# Patient Record
Sex: Female | Born: 1960 | Race: Black or African American | Hispanic: No | Marital: Single | Smoking: Never smoker
Health system: Southern US, Community
[De-identification: ages and names within clinical notes are randomized; demographics above are authoritative.]

## PROBLEM LIST (undated history)

## (undated) DIAGNOSIS — Z8619 Personal history of other infectious and parasitic diseases: Secondary | ICD-10-CM

## (undated) DIAGNOSIS — D219 Benign neoplasm of connective and other soft tissue, unspecified: Secondary | ICD-10-CM

## (undated) DIAGNOSIS — R030 Elevated blood-pressure reading, without diagnosis of hypertension: Secondary | ICD-10-CM

## (undated) DIAGNOSIS — B029 Zoster without complications: Secondary | ICD-10-CM

## (undated) DIAGNOSIS — T7840XA Allergy, unspecified, initial encounter: Secondary | ICD-10-CM

## (undated) HISTORY — DX: Allergy, unspecified, initial encounter: T78.40XA

## (undated) HISTORY — PX: OOPHORECTOMY: SHX86

## (undated) HISTORY — DX: Elevated blood-pressure reading, without diagnosis of hypertension: R03.0

## (undated) HISTORY — DX: Benign neoplasm of connective and other soft tissue, unspecified: D21.9

## (undated) HISTORY — DX: Zoster without complications: B02.9

## (undated) HISTORY — PX: MYOMECTOMY: SHX85

## (undated) HISTORY — DX: Personal history of other infectious and parasitic diseases: Z86.19

---

## 2009-02-20 DIAGNOSIS — B029 Zoster without complications: Secondary | ICD-10-CM

## 2009-02-20 HISTORY — DX: Zoster without complications: B02.9

## 2009-11-22 HISTORY — PX: ABDOMINAL HYSTERECTOMY: SHX81

## 2011-07-19 ENCOUNTER — Other Ambulatory Visit: Payer: Self-pay | Admitting: Unknown Physician Specialty

## 2011-08-10 ENCOUNTER — Ambulatory Visit: Payer: Self-pay | Admitting: Family Medicine

## 2012-08-14 ENCOUNTER — Ambulatory Visit: Payer: Self-pay | Admitting: Family Medicine

## 2013-08-23 ENCOUNTER — Ambulatory Visit: Payer: Self-pay | Admitting: Family Medicine

## 2014-08-27 ENCOUNTER — Ambulatory Visit: Payer: Self-pay | Admitting: Family Medicine

## 2015-07-31 ENCOUNTER — Telehealth: Payer: Self-pay | Admitting: Family Medicine

## 2015-07-31 NOTE — Telephone Encounter (Signed)
Routed to Dr. Shah for referral approval 

## 2015-07-31 NOTE — Telephone Encounter (Signed)
Pt is requesting a referral for her annual mammogram. Last seen here was 03-24-15

## 2015-07-31 NOTE — Telephone Encounter (Signed)
Patient should schedule an appointment for yearly physical and a referral for annual mammogram will be placed at that time.

## 2015-08-01 NOTE — Telephone Encounter (Signed)
Left voicemail asking patient to call and schedule yearly physical and mammogram referral will be placed at that time per Dr. Manuella Ghazi

## 2015-08-21 ENCOUNTER — Other Ambulatory Visit: Payer: Self-pay | Admitting: Family Medicine

## 2015-08-21 DIAGNOSIS — Z1231 Encounter for screening mammogram for malignant neoplasm of breast: Secondary | ICD-10-CM

## 2015-09-01 ENCOUNTER — Ambulatory Visit
Admission: RE | Admit: 2015-09-01 | Discharge: 2015-09-01 | Disposition: A | Discharge status: Home / Self Care | Payer: 59 | Source: Ambulatory Visit | Attending: Family Medicine | Admitting: Family Medicine

## 2015-09-01 DIAGNOSIS — Z1231 Encounter for screening mammogram for malignant neoplasm of breast: Secondary | ICD-10-CM | POA: Diagnosis not present

## 2016-08-18 ENCOUNTER — Other Ambulatory Visit: Payer: Self-pay | Admitting: Family Medicine

## 2016-08-18 DIAGNOSIS — Z1231 Encounter for screening mammogram for malignant neoplasm of breast: Secondary | ICD-10-CM

## 2016-09-15 ENCOUNTER — Ambulatory Visit
Admission: RE | Admit: 2016-09-15 | Discharge: 2016-09-15 | Disposition: A | Discharge status: Home / Self Care | Payer: 59 | Source: Ambulatory Visit | Attending: Family Medicine | Admitting: Family Medicine

## 2016-09-15 DIAGNOSIS — Z1231 Encounter for screening mammogram for malignant neoplasm of breast: Secondary | ICD-10-CM | POA: Diagnosis not present

## 2017-09-26 ENCOUNTER — Other Ambulatory Visit: Payer: Self-pay | Admitting: Family Medicine

## 2017-09-26 DIAGNOSIS — Z1231 Encounter for screening mammogram for malignant neoplasm of breast: Secondary | ICD-10-CM

## 2017-10-10 ENCOUNTER — Ambulatory Visit
Admission: RE | Admit: 2017-10-10 | Discharge: 2017-10-10 | Disposition: A | Discharge status: Home / Self Care | Payer: 59 | Source: Ambulatory Visit | Attending: Family Medicine | Admitting: Family Medicine

## 2017-10-10 DIAGNOSIS — Z1231 Encounter for screening mammogram for malignant neoplasm of breast: Secondary | ICD-10-CM | POA: Insufficient documentation

## 2018-09-13 ENCOUNTER — Ambulatory Visit: Payer: 59 | Admitting: Internal Medicine

## 2018-09-13 ENCOUNTER — Encounter: Payer: Self-pay | Admitting: Internal Medicine

## 2018-09-13 VITALS — BP 132/86 | HR 72 | Temp 98.3°F | Ht 66.0 in | Wt 215.2 lb

## 2018-09-13 DIAGNOSIS — Z1231 Encounter for screening mammogram for malignant neoplasm of breast: Secondary | ICD-10-CM

## 2018-09-13 DIAGNOSIS — Z13220 Encounter for screening for lipoid disorders: Secondary | ICD-10-CM

## 2018-09-13 DIAGNOSIS — J309 Allergic rhinitis, unspecified: Secondary | ICD-10-CM | POA: Diagnosis not present

## 2018-09-13 DIAGNOSIS — Z1159 Encounter for screening for other viral diseases: Secondary | ICD-10-CM

## 2018-09-13 DIAGNOSIS — Z1329 Encounter for screening for other suspected endocrine disorder: Secondary | ICD-10-CM

## 2018-09-13 DIAGNOSIS — Z1389 Encounter for screening for other disorder: Secondary | ICD-10-CM

## 2018-09-13 DIAGNOSIS — Z0000 Encounter for general adult medical examination without abnormal findings: Secondary | ICD-10-CM | POA: Diagnosis not present

## 2018-09-13 DIAGNOSIS — E559 Vitamin D deficiency, unspecified: Secondary | ICD-10-CM

## 2018-09-13 DIAGNOSIS — Z13818 Encounter for screening for other digestive system disorders: Secondary | ICD-10-CM

## 2018-09-13 NOTE — Progress Notes (Signed)
Pre visit review using our clinic review tool, if applicable. No additional management support is needed unless otherwise documented below in the visit note. 

## 2018-09-13 NOTE — Progress Notes (Signed)
Chief Complaint  Patient presents with  . Establish Care   HPI ROS Past Medical History:  Diagnosis Date  . Allergy   . Blood pressure elevated without history of HTN   . Fibroids   . History of chicken pox   . Shingles rash 02/2009   right chest, back and arm    Past Surgical History:  Procedure Laterality Date  . ABDOMINAL HYSTERECTOMY  2011   left ovary intact    Family History  Problem Relation Age of Onset  . Breast cancer Cousin   . Breast cancer Maternal Aunt 65  . Breast cancer Cousin   . Cancer Mother        ?type  . Heart disease Father        3 stents now with pacmaker   . Stroke Father        x 2   . Anemia Sister   . Gout Brother   . Liver disease Brother   . Polymyositis Sister    Social History   Socioeconomic History  . Marital status: Single    Spouse name: Not on file  . Number of children: Not on file  . Years of education: Not on file  . Highest education level: Not on file  Occupational History  . Not on file  Social Needs  . Financial resource strain: Not on file  . Food insecurity:    Worry: Not on file    Inability: Not on file  . Transportation needs:    Medical: Not on file    Non-medical: Not on file  Tobacco Use  . Smoking status: Never Smoker  . Smokeless tobacco: Never Used  Substance and Sexual Activity  . Alcohol use: Not Currently  . Drug use: Not Currently  . Sexual activity: Not Currently  Lifestyle  . Physical activity:    Days per week: Not on file    Minutes per session: Not on file  . Stress: Not on file  Relationships  . Social connections:    Talks on phone: Not on file    Gets together: Not on file    Attends religious service: Not on file    Active member of club or organization: Not on file    Attends meetings of clubs or organizations: Not on file    Relationship status: Not on file  . Intimate partner violence:    Fear of current or ex partner: Not on file    Emotionally abused: Not on file   Physically abused: Not on file    Forced sexual activity: Not on file  Other Topics Concern  . 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   Allergies  Allergen Reactions  . Cat Hair Extract   . Dust Mite Extract   . Grass Extracts [Gramineae Pollens]   . Lidocaine Other (See Comments)    lidoderm patch causes blisters  . Penicillins Other (See Comments)    Childhood rxn   No results found for this or any previous visit (from the past 2160 hour(s)). Objective  Body mass index is 34.73 kg/m. Wt Readings from Last  3 Encounters:  09/13/18 215 lb 3.2 oz (97.6 kg)   Temp Readings from Last 3 Encounters:  09/13/18 98.3 F (36.8 C) (Oral)   BP Readings from Last 3 Encounters:  09/13/18 132/86   Pulse Readings from Last 3 Encounters:  09/13/18 72    Physical Exam  Assessment   1. Allergic rhinitis  2. H/o HTN though BP controlled today  3. HM Plan   1. Uses Flonase, allegra and NS  2. Monitor pt changed idet  3.  Declines flu shot  Disc Tdap and shingrix today  Mammogram referred today due 10/10/18  S/p hysterectomy 2011 for fibroids no h/o abnormal pap, 1 ovary intact left  Pt wants to wait on colonoscopy until 11/2018  sch fasting labs   Of note h/o ulcers clarify with pt if GI or where had ulcers  Former PCP Dr. Charna Archer  Provider: Dr. Olivia Mackie McLean-Scocuzza-[facility]

## 2018-09-13 NOTE — Patient Instructions (Addendum)
Consider Shingrix vaccine x 2 doses (read below if interested)  Consider Tdap vaccine  Schedule mammogram 10/10/18  F/u 11/2018 after labs asap    Tdap/DTaP Vaccine (Diphtheria, Tetanus, and Pertussis): What You Need to Know 1. Why get vaccinated? Diphtheria, tetanus, and pertussis are serious diseases caused by bacteria. Diphtheria and pertussis are spread from person to person. Tetanus enters the body through cuts or wounds. DIPHTHERIA causes a thick covering in the back of the throat.  It can lead to breathing problems, paralysis, heart failure, and even death.  TETANUS (Lockjaw) causes painful tightening of the muscles, usually all over the body.  It can lead to "locking" of the jaw so the victim cannot open his mouth or swallow. Tetanus leads to death in up to 2 out of 10 cases.  PERTUSSIS (Whooping Cough) causes coughing spells so bad that it is hard for infants to eat, drink, or breathe. These spells can last for weeks.  It can lead to pneumonia, seizures (jerking and staring spells), brain damage, and death.  Diphtheria, tetanus, and pertussis vaccine (DTaP) can help prevent these diseases. Most children who are vaccinated with DTaP will be protected throughout childhood. Many more children would get these diseases if we stopped vaccinating. DTaP is a safer version of an older vaccine called DTP. DTP is no longer used in the [place]. 2. Who should get DTaP vaccine and when? Children should get 5 doses of DTaP vaccine, one dose at each of the following ages:  2 months  4 months  6 months  15-18 months  4-6 years  DTaP may be given at the same time as other vaccines. 3. Some children should not get DTaP vaccine or should wait  Children with minor illnesses, such as a cold, may be vaccinated. But children who are moderately or severely ill should usually wait until they recover before getting DTaP vaccine.  Any child who had a life-threatening allergic reaction  after a dose of DTaP should not get another dose.  Any child who suffered a brain or nervous system disease within 7 days after a dose of DTaP should not get another dose.  Talk with your doctor if your child: ? had a seizure or collapsed after a dose of DTaP, ? cried non-stop for 3 hours or more after a dose of DTaP, ? had a fever over 105F after a dose of DTaP. Ask your doctor for more information. Some of these children should not get another dose of pertussis vaccine, but may get a vaccine without pertussis, called DT. 4. Older children and adults DTaP is not licensed for adolescents, adults, or children 68 years of age and older. But older people still need protection. A vaccine called Tdap is similar to DTaP. A single dose of Tdap is recommended for people 11 through 57 years of age. Another vaccine, called Td, protects against tetanus and diphtheria, but not pertussis. It is recommended every 10 years. There are separate Vaccine Information Statements for these vaccines. 5. What are the risks from DTaP vaccine? Getting diphtheria, tetanus, or pertussis disease is much riskier than getting DTaP vaccine. However, a vaccine, like any medicine, is capable of causing serious problems, such as severe allergic reactions. The risk of DTaP vaccine causing serious harm, or death, is extremely small. Mild problems (common)  Fever (up to about 1 child in 4)  Redness or swelling where the shot was given (up to about 1 child in 4)  Soreness or tenderness where the  shot was given (up to about 1 child in 4) These problems occur more often after the 4th and 5th doses of the DTaP series than after earlier doses. Sometimes the 4th or 5th dose of DTaP vaccine is followed by swelling of the entire arm or leg in which the shot was given, lasting 1-7 days (up to about 1 child in 63). Other mild problems include:  Fussiness (up to about 1 child in 3)  Tiredness or poor appetite (up to about 1 child in  10)  Vomiting (up to about 1 child in 7) These problems generally occur 1-3 days after the shot. Moderate problems (uncommon)  Seizure (jerking or staring) (about 1 child out of 14,000)  Non-stop crying, for 3 hours or more (up to about 1 child out of 1,000)  High fever, over 105F (about 1 child out of 16,000) Severe problems (very rare)  Serious allergic reaction (less than 1 out of a million doses)  Several other severe problems have been reported after DTaP vaccine. These include: ? Long-term seizures, coma, or lowered consciousness ? Permanent brain damage. These are so rare it is hard to tell if they are caused by the vaccine. Controlling fever is especially important for children who have had seizures, for any reason. It is also important if another family member has had seizures. You can reduce fever and pain by giving your child an aspirin-free pain reliever when the shot is given, and for the next 24 hours, following the package instructions. 6. What if there is a serious reaction? What should I look for? Look for anything that concerns you, such as signs of a severe allergic reaction, very high fever, or behavior changes. Signs of a severe allergic reaction can include hives, swelling of the face and throat, difficulty breathing, a fast heartbeat, dizziness, and weakness. These would start a few minutes to a few hours after the vaccination. What should I do?  If you think it is a severe allergic reaction or other emergency that can't wait, call 9-1-1 or get the person to the nearest hospital. Otherwise, call your doctor.  Afterward, the reaction should be reported to the Vaccine Adverse Event Reporting System (VAERS). Your doctor might file this report, or you can do it yourself through the VAERS web site at www.vaers.SamedayNews.es, or by calling 985-357-8724. ? VAERS is only for reporting reactions. They do not give medical advice. 7. The National Vaccine Injury Compensation  Program The [employer] Vaccine Injury Compensation Program (VICP) is a federal program that was created to compensate people who may have been injured by certain vaccines. Persons who believe they may have been injured by a vaccine can learn about the program and about filing a claim by calling (609)623-3463 or visiting the [employer] website at GoldCloset.com.ee. 8. How can I learn more?  Ask your doctor.  Call your local or state health department.  Contact the Centers for Disease Control and Prevention (CDC): ? Call (616)797-9585 (1-800-CDC-INFO) or ? Visit CDC's website at http://hunter.com/ CDC DTaP Vaccine (Diphtheria, Tetanus, and Pertussis) VIS (04/07/06) This information is not intended to replace advice given to you by your health care provider. Make sure you discuss any questions you have with your health care provider. Document Released: 09/05/2006 Document Revised: 07/29/2016 Document Reviewed: 07/29/2016 Elsevier Interactive Patient Education  2017 Elsevier Inc.   Postherpetic Neuralgia Postherpetic neuralgia (PHN) is nerve pain that occurs after a shingles infection. Shingles is a painful rash that appears on one side of the body,  usually on your trunk or face. Shingles is caused by the varicella-zoster virus. This is the same virus that causes chickenpox. In people who have had chickenpox, the virus can resurface years later and cause shingles. You may have PHN if you continue to have pain for 3 months after your shingles rash has gone away. PHN appears in the same area where you had the shingles rash. For most people, PHN goes away within 1 year. Getting a vaccination for shingles can prevent PHN. This vaccine is recommended for people older than 50. It may prevent shingles and may also lower your risk of PHN if you do get shingles. What are the causes? PHN is caused by damage to your nerves from the varicella-zoster virus. This damage makes your nerves overly  sensitive. What increases the risk? Aging is the biggest risk factor for developing PHN. Most people who get PHN are older than 32. Other risk factors include:  Having very bad pain before your shingles rash starts.  Having a very bad rash.  Having shingles in the nerve that supplies your face and eye (trigeminal nerve).  What are the signs or symptoms? Pain is the main symptom of PHN. The pain is often very bad and may be described as stabbing, burning, or feeling like an electric shock. The pain may come and go or may be there all the time. Pain may be triggered by light touches on the skin or changes in temperature. You may have itching along with the pain. How is this diagnosed? Your health care provider may diagnose PHN based on your symptoms and your history of shingles. Lab studies and other diagnostic tests are usually not needed. How is this treated? There is no cure for PHN. Treatment for PHN will focus on pain relief. Over-the-counter pain relievers do not usually relieve PHN pain. You may need to work with a pain specialist. Treatment may include:  Antidepressant medicines to help with pain and improve sleep.  Antiseizure medicines to relieve nerve pain.  Strong pain relievers (opioids).  A numbing patch worn on the skin (lidocaine patch).  Follow these instructions at home: It may take a long time to recover from PHN. Work closely with your health care provider, and have a good support system at home.  Take all medicines as directed by your health care provider.  Wear loose, comfortable clothing.  Cover sensitive areas with a dressing to reduce friction from clothing rubbing on the area.  If cold does not make your pain worse, try applying a cool compress or cooling gel pack to the area.  Talk to your health care provider if you feel depressed or desperate. Living with long-term pain can be depressing.  Contact a health care provider if:  Your medicine is not  helping.  You are struggling to manage your pain at home. This information is not intended to replace advice given to you by your health care provider. Make sure you discuss any questions you have with your health care provider. Document Released: 01/29/2003 Document Revised: 04/15/2016 Document Reviewed: 10/30/2013 Elsevier Interactive Patient Education  [employer].  Colonoscopy, Adult A colonoscopy is an exam to look at the entire large intestine. During the exam, a lubricated, bendable tube is inserted into the anus and then passed into the rectum, colon, and other parts of the large intestine. A colonoscopy is often done as a part of normal colorectal screening or in response to certain symptoms, such as anemia, persistent diarrhea, abdominal pain,  and blood in the stool. The exam can help screen for and diagnose medical problems, including:  Tumors.  Polyps.  Inflammation.  Areas of bleeding.  Tell a health care provider about:  Any allergies you have.  All medicines you are taking, including vitamins, herbs, eye drops, creams, and over-the-counter medicines.  Any problems you or family members have had with anesthetic medicines.  Any blood disorders you have.  Any surgeries you have had.  Any medical conditions you have.  Any problems you have had passing stool. What are the risks? Generally, this is a safe procedure. However, problems may occur, including:  Bleeding.  A tear in the intestine.  A reaction to medicines given during the exam.  Infection (rare).  What happens before the procedure? Eating and drinking restrictions Follow instructions from your health care provider about eating and drinking, which may include:  A few days before the procedure - follow a low-fiber diet. Avoid nuts, seeds, dried fruit, raw fruits, and vegetables.  1-3 days before the procedure - follow a clear liquid diet. Drink only clear liquids, such as clear broth or  bouillon, black coffee or tea, clear juice, clear soft drinks or sports drinks, gelatin dessert, and popsicles. Avoid any liquids that contain red or purple dye.  On the day of the procedure - do not eat or drink anything during the 2 hours before the procedure, or within the time period that your health care provider recommends.  Bowel prep If you were prescribed an oral bowel prep to clean out your colon:  Take it as told by your health care provider. Starting the day before your procedure, you will need to drink a large amount of medicated liquid. The liquid will cause you to have multiple loose stools until your stool is almost clear or light green.  If your skin or anus gets irritated from diarrhea, you may use these to relieve the irritation: ? Medicated wipes, such as adult wet wipes with aloe and vitamin E. ? A skin soothing-product like petroleum jelly.  If you vomit while drinking the bowel prep, take a break for up to 60 minutes and then begin the bowel prep again. If vomiting continues and you cannot take the bowel prep without vomiting, call your health care provider.  General instructions  Ask your health care provider about changing or stopping your regular medicines. This is especially important if you are taking diabetes medicines or blood thinners.  Plan to have someone take you home from the hospital or clinic. What happens during the procedure?  An IV tube may be inserted into one of your veins.  You will be given medicine to help you relax (sedative).  To reduce your risk of infection: ? Your health care team will wash or sanitize their hands. ? Your anal area will be washed with soap.  You will be asked to lie on your side with your knees bent.  Your health care provider will lubricate a long, thin, flexible tube. The tube will have a camera and a light on the end.  The tube will be inserted into your anus.  The tube will be gently eased through your rectum  and colon.  Air will be delivered into your colon to keep it open. You may feel some pressure or cramping.  The camera will be used to take images during the procedure.  A small tissue sample may be removed from your body to be examined under a microscope (biopsy). If any  potential problems are found, the tissue will be sent to a lab for testing.  If small polyps are found, your health care provider may remove them and have them checked for cancer cells.  The tube that was inserted into your anus will be slowly removed. The procedure may vary among health care providers and hospitals. What happens after the procedure?  Your blood pressure, heart rate, breathing rate, and blood oxygen level will be monitored until the medicines you were given have worn off.  Do not drive for 24 hours after the exam.  You may have a small amount of blood in your stool.  You may pass gas and have mild abdominal cramping or bloating due to the air that was used to inflate your colon during the exam.  It is up to you to get the results of your procedure. Ask your health care provider, or the department performing the procedure, when your results will be ready. This information is not intended to replace advice given to you by your health care provider. Make sure you discuss any questions you have with your health care provider. Document Released: 11/05/2000 Document Revised: 09/08/2016 Document Reviewed: 01/20/2016 Elsevier Interactive Patient Education  2018 [place] Zoster (Shingles) Vaccine, RZV: What You Need to Know 1. Why get vaccinated? Shingles (also called herpes zoster, or just zoster) is a painful skin rash, often with blisters. Shingles is caused by the varicella zoster virus, the same virus that causes chickenpox. After you have chickenpox, the virus stays in your body and can cause shingles later in life. You can't catch shingles from another person. However, a person who  has never had chickenpox (or chickenpox vaccine) could get chickenpox from someone with shingles. A shingles rash usually appears on one side of the face or body and heals within 2 to 4 weeks. Its main symptom is pain, which can be severe. Other symptoms can include fever, headache, chills and upset stomach. Very rarely, a shingles infection can lead to pneumonia, hearing problems, blindness, brain inflammation (encephalitis), or death. For about 1 person in 5, severe pain can continue even long after the rash has cleared up. This long-lasting pain is called post-herpetic neuralgia (PHN). Shingles is far more common in people 49 years of age and older than in younger people, and the risk increases with age. It is also more common in people whose immune system is weakened because of a disease such as cancer, or by drugs such as steroids or chemotherapy. At least 1 million people a year in the [place] get shingles. 2. Shingles vaccine (recombinant) Recombinant shingles vaccine was approved by FDA in 2017 for the prevention of shingles. In clinical trials, it was more than 90% effective in preventing shingles. It can also reduce the likelihood of PHN. Two doses, 2 to 6 months apart, are recommended for adults 34 and older. This vaccine is also recommended for people who have already gotten the live shingles vaccine (Zostavax). There is no live virus in this vaccine. 3. Some people should not get this vaccine Tell your vaccine provider if you:  Have any severe, life-threatening allergies. A person who has ever had a life-threatening allergic reaction after a dose of recombinant shingles vaccine, or has a severe allergy to any component of this vaccine, may be advised not to be vaccinated. Ask your health care provider if you want information about vaccine components.  Are pregnant or breastfeeding. There is not much information about use of  recombinant shingles vaccine in pregnant or nursing women.  Your healthcare provider might recommend delaying vaccination.  Are not feeling well. If you have a mild illness, such as a cold, you can probably get the vaccine today. If you are moderately or severely ill, you should probably wait until you recover. Your doctor can advise you.  4. Risks of a vaccine reaction With any medicine, including vaccines, there is a chance of reactions. After recombinant shingles vaccination, a person might experience:  Pain, redness, soreness, or swelling at the site of the injection  Headache, muscle aches, fever, shivering, fatigue  In clinical trials, most people got a sore arm with mild or moderate pain after vaccination, and some also had redness and swelling where they got the shot. Some people felt tired, had muscle pain, a headache, shivering, fever, stomach pain, or nausea. About 1 out of 6 people who got recombinant zoster vaccine experienced side effects that prevented them from doing regular activities. Symptoms went away on their own in about 2 to 3 days. Side effects were more common in younger people. You should still get the second dose of recombinant zoster vaccine even if you had one of these reactions after the first dose. Other things that could happen after this vaccine:  People sometimes faint after medical procedures, including vaccination. Sitting or lying down for about 15 minutes can help prevent fainting and injuries caused by a fall. Tell your provider if you feel dizzy or have vision changes or ringing in the ears.  Some people get shoulder pain that can be more severe and longer-lasting than routine soreness that can follow injections. This happens very rarely.  Any medication can cause a severe allergic reaction. Such reactions to a vaccine are estimated at about 1 in a million doses, and would happen within a few minutes to a few hours after the vaccination. As with any medicine, there is a very remote chance of a vaccine causing a  serious injury or death. The safety of vaccines is always being monitored. For more information, visit: http://www.aguilar.org/ 5. What if there is a serious problem? What should I look for?  Look for anything that concerns you, such as signs of a severe allergic reaction, very high fever, or unusual behavior. Signs of a severe allergic reaction can include hives, swelling of the face and throat, difficulty breathing, a fast heartbeat, dizziness, and weakness. These would usually start a few minutes to a few hours after the vaccination. What should I do?  If you think it is a severe allergic reaction or other emergency that can't wait, call 9-1-1 and get to the nearest hospital. Otherwise, call your health care provider. Afterward, the reaction should be reported to the Vaccine Adverse Event Reporting System (VAERS). Your doctor should file this report, or you can do it yourself through the VAERS web site atwww.vaers.https://www.bray.com/ by calling 512-732-6360. VAERS does not give medical advice. 6. How can I learn more?  Ask your healthcare provider. He or she can give you the vaccine package insert or suggest other sources of information.  Call your local or state health department.  Contact the Centers for Disease Control and Prevention (CDC): ? Call 618-333-0240 (1-800-CDC-INFO) or ? Visit the CDC's website at http://hunter.com/ CDC Vaccine Information Statement (VIS) Recombinant Zoster Vaccine (01/03/2017) This information is not intended to replace advice given to you by your health care provider. Make sure you discuss any questions you have with your health care provider. Document Released: 01/18/2017 Document  Revised: 01/18/2017 Document Reviewed: 01/18/2017 Elsevier Interactive Patient Education  [employer].

## 2018-10-13 ENCOUNTER — Ambulatory Visit
Admission: RE | Admit: 2018-10-13 | Discharge: 2018-10-13 | Disposition: A | Discharge status: Home / Self Care | Payer: 59 | Source: Ambulatory Visit | Attending: Internal Medicine | Admitting: Internal Medicine

## 2018-10-13 DIAGNOSIS — Z1231 Encounter for screening mammogram for malignant neoplasm of breast: Secondary | ICD-10-CM | POA: Diagnosis not present

## 2018-11-24 ENCOUNTER — Other Ambulatory Visit: Payer: 59

## 2018-12-01 ENCOUNTER — Ambulatory Visit: Payer: 59 | Admitting: Internal Medicine

## 2020-04-10 IMAGING — MG DIGITAL SCREENING BILATERAL MAMMOGRAM WITH TOMO AND CAD
8 series · 8 of 24 positions shown · non-contrast
Comparison: Previous exam(s).

CLINICAL DATA: Screening.

EXAM:
DIGITAL SCREENING BILATERAL MAMMOGRAM WITH TOMO AND CAD

[L MLO synth-2D]
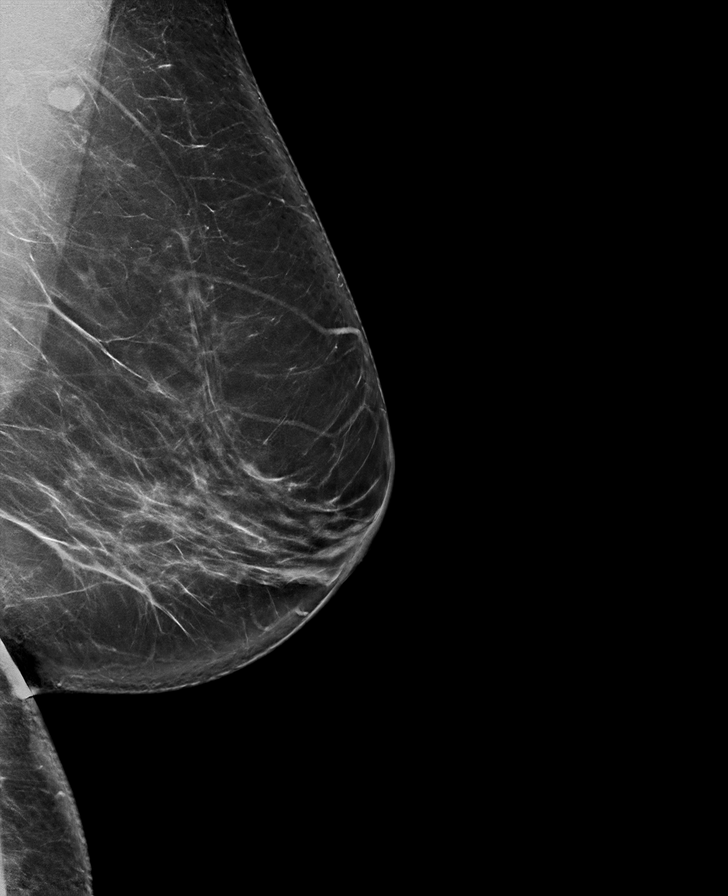

[R CC synth-2D]
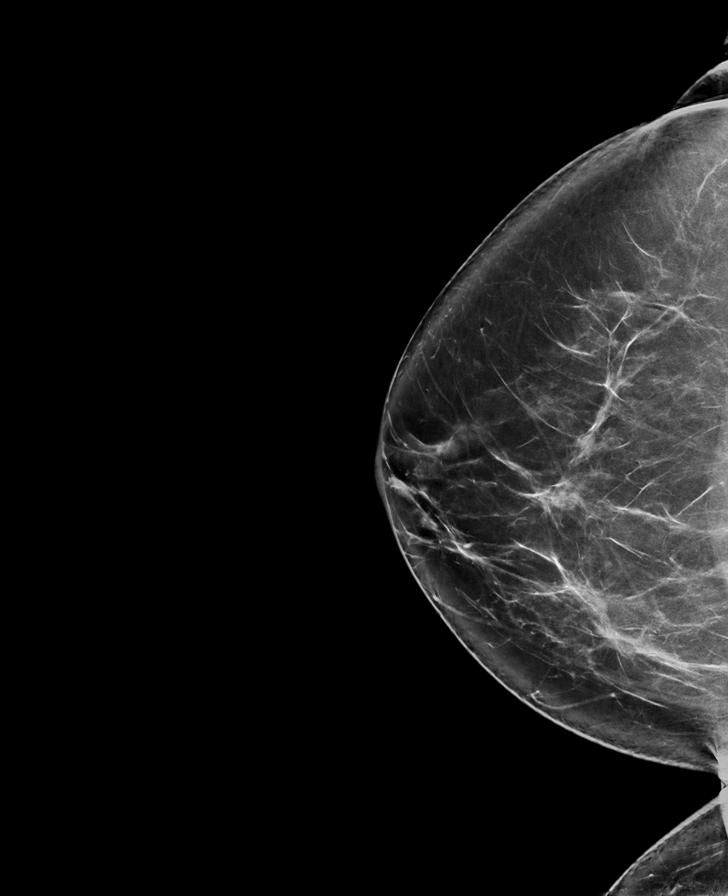

[L CC synth-2D]
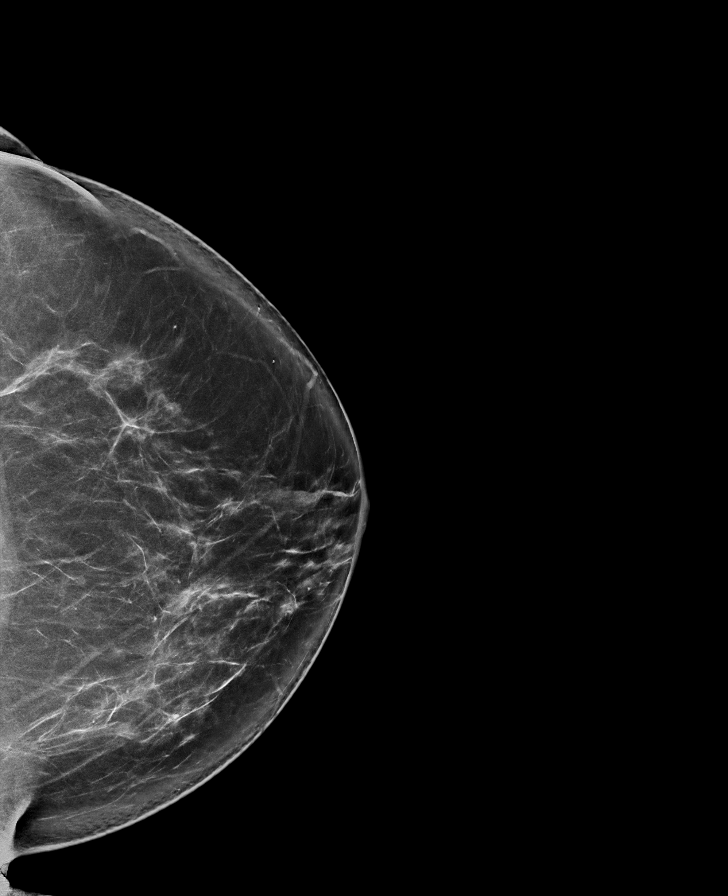

[R MLO synth-2D]
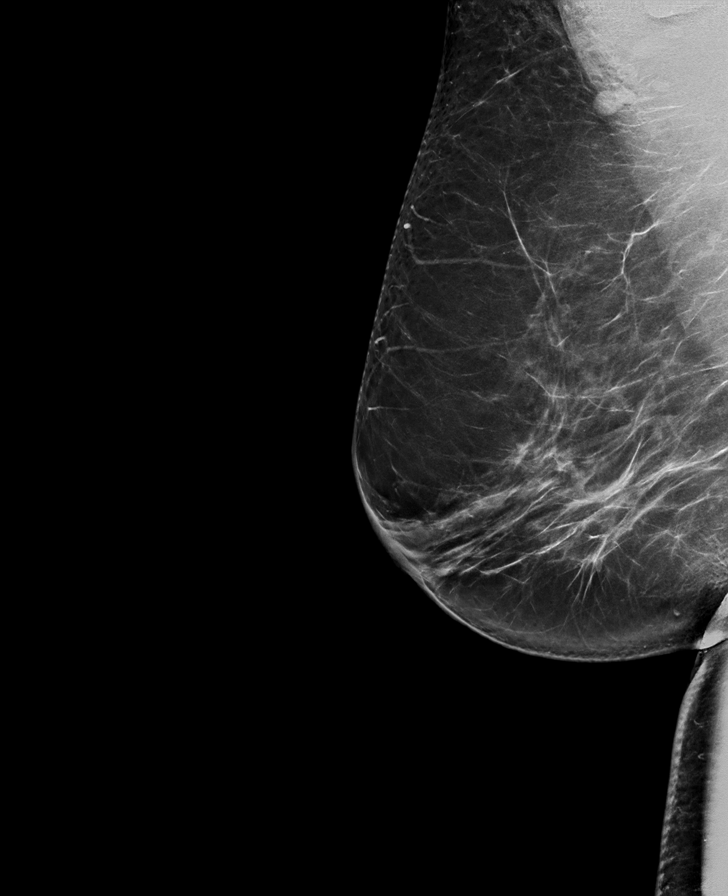

[R CC tomo · tomo slice 43/86.0]
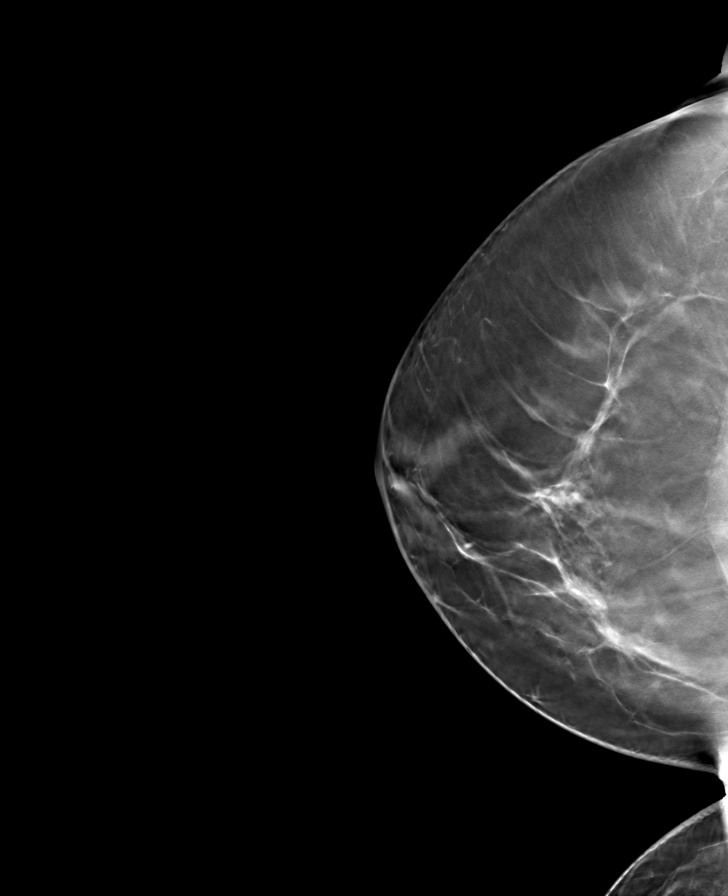

[L MLO tomo · tomo slice 45/89.0]
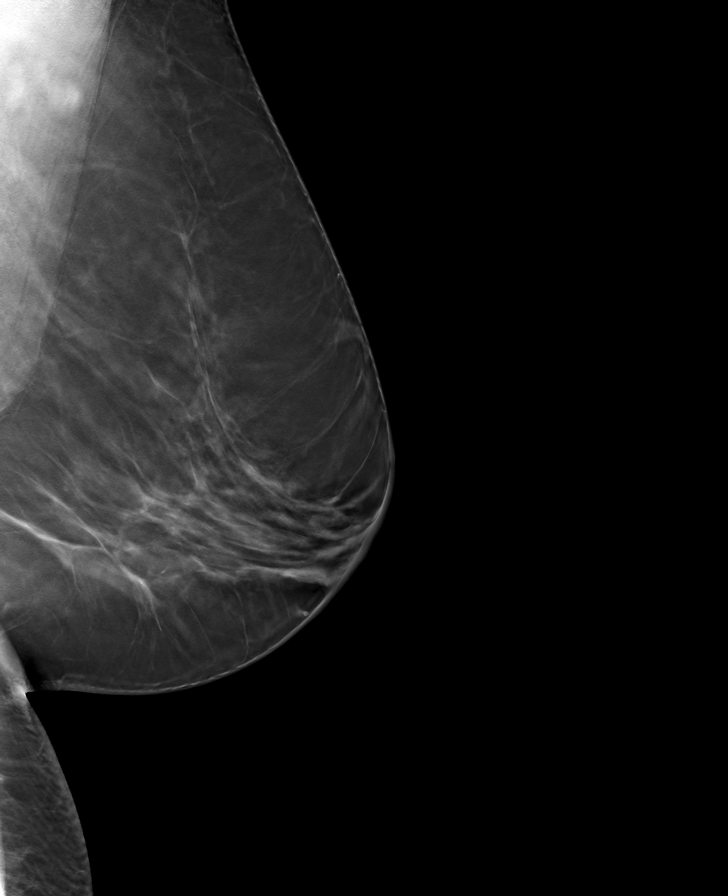

[R MLO tomo · tomo slice 44/87.0]
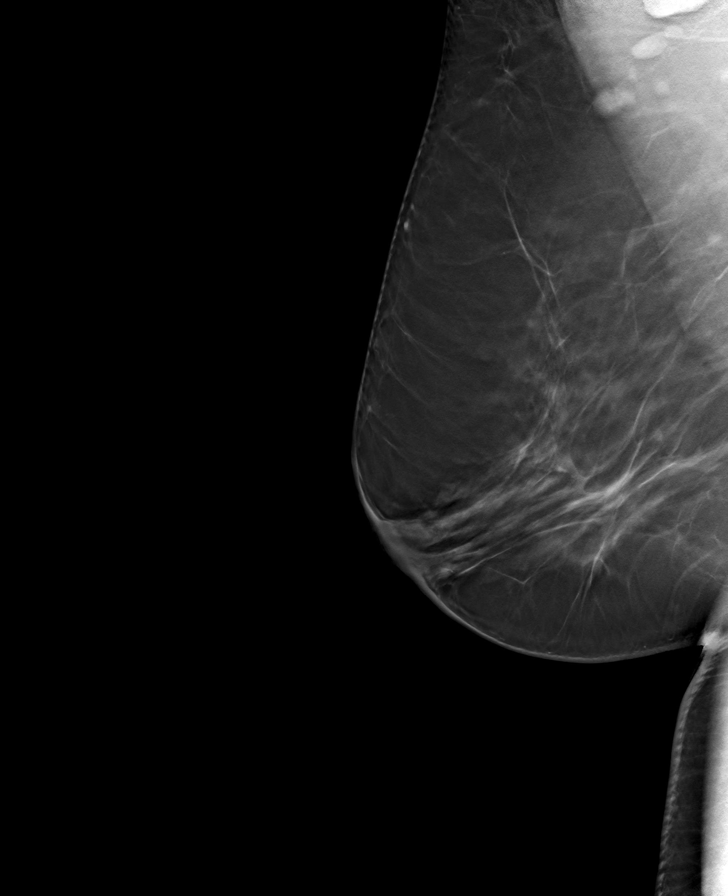

[L CC tomo · tomo slice 45/89.0]
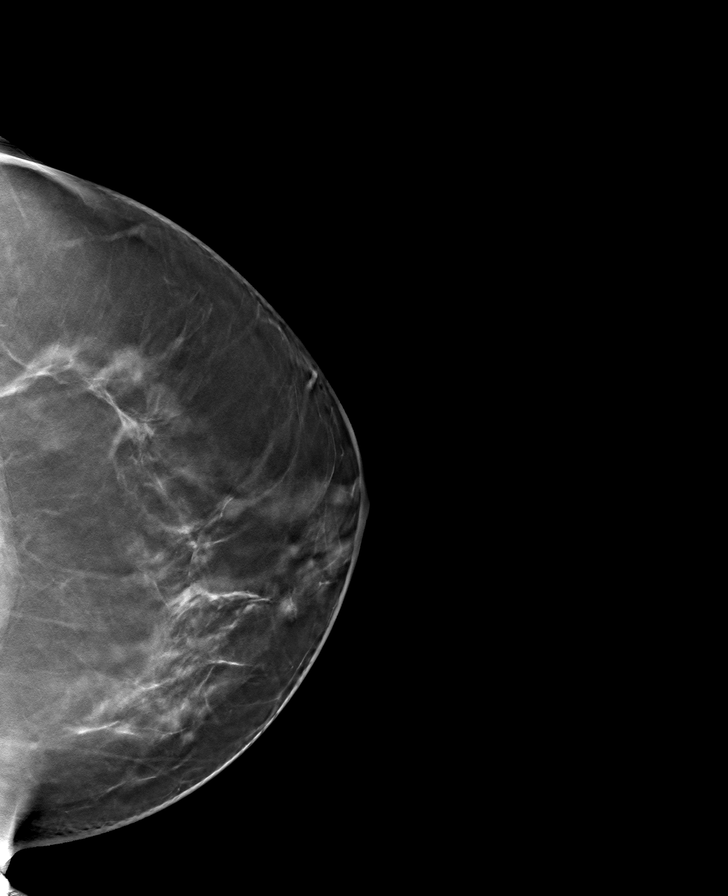

[8 of 24 positions shown; findings below may reference images not displayed]

ACR Breast Density Category b: There are scattered areas of
fibroglandular density.
FINDINGS: There are no findings suspicious for malignancy. Images were
processed with CAD.
IMPRESSION: No mammographic evidence of malignancy. A result letter of this
screening mammogram will be mailed directly to the patient.

RECOMMENDATION:
Screening mammogram in one year. (Code:[id number])

BI-RADS CATEGORY  1: Negative.
